# Patient Record
Sex: Female | Born: 1990 | Race: Black or African American | Hispanic: No | Marital: Married
Health system: Southern US, Community
[De-identification: ages and names within clinical notes are randomized; demographics above are authoritative.]

## PROBLEM LIST (undated history)

## (undated) HISTORY — PX: DILATION AND CURETTAGE OF UTERUS: SHX78

---

## 2016-12-19 ENCOUNTER — Emergency Department (HOSPITAL_BASED_OUTPATIENT_CLINIC_OR_DEPARTMENT_OTHER): Payer: Medicaid Other

## 2016-12-19 ENCOUNTER — Encounter (HOSPITAL_BASED_OUTPATIENT_CLINIC_OR_DEPARTMENT_OTHER): Payer: Self-pay | Admitting: *Deleted

## 2016-12-19 ENCOUNTER — Emergency Department (HOSPITAL_BASED_OUTPATIENT_CLINIC_OR_DEPARTMENT_OTHER)
Admission: EM | Admit: 2016-12-19 | Discharge: 2016-12-19 | Disposition: A | Discharge status: Home / Self Care | Payer: Medicaid Other | Attending: Emergency Medicine | Admitting: Emergency Medicine

## 2016-12-19 DIAGNOSIS — W109XXA Fall (on) (from) unspecified stairs and steps, initial encounter: Secondary | ICD-10-CM | POA: Diagnosis not present

## 2016-12-19 DIAGNOSIS — Y999 Unspecified external cause status: Secondary | ICD-10-CM | POA: Insufficient documentation

## 2016-12-19 DIAGNOSIS — Y929 Unspecified place or not applicable: Secondary | ICD-10-CM | POA: Diagnosis not present

## 2016-12-19 DIAGNOSIS — M25572 Pain in left ankle and joints of left foot: Secondary | ICD-10-CM | POA: Diagnosis present

## 2016-12-19 DIAGNOSIS — Y939 Activity, unspecified: Secondary | ICD-10-CM | POA: Insufficient documentation

## 2016-12-19 MED ORDER — ACETAMINOPHEN 325 MG PO TABS
650.0000 mg | ORAL_TABLET | Freq: Once | ORAL | Status: AC
  Administered 2016-12-19: 650 mg via ORAL
  Filled 2016-12-19: qty 2

## 2016-12-19 MED ORDER — IBUPROFEN 400 MG PO TABS
600.0000 mg | ORAL_TABLET | Freq: Once | ORAL | Status: DC
  Filled 2016-12-19: qty 1

## 2016-12-19 MED ORDER — ACETAMINOPHEN 500 MG PO TABS
1000.0000 mg | ORAL_TABLET | Freq: Once | ORAL | Status: DC
  Filled 2016-12-19: qty 2

## 2016-12-19 NOTE — ED Provider Notes (Signed)
[facility] Provider Note   CSN: [id number] Arrival date & time: 12/19/16  1911  By signing my name below, I, Linna Darner, attest that this documentation has been prepared under the direction and in the presence of Sharen Heck, PA-C. Electronically Signed: Linna Darner, Scribe. 12/19/2016. 8:26 PM.  History   Chief Complaint Chief Complaint  Patient presents with  . Ankle Injury   The history is provided by the patient. No language interpreter was used.   HPI Comments: Terri Gilmore is a 26 y.o. female who presents to the Emergency Department for evaluation of a left ankle injury sustained a few hours ago. She states she fell down a couple of steps to the ground and injured her left ankle. No head trauma or LOC. Patient reports pain to her left lateral ankle as well as some swelling. Patient endorses pain exacerbation with dorsiflexion and weight bearing. She is ambulatory with difficulty secondary to pain. No medications or treatments tried PTA. She denies numbness/tingling, bruising, other injuries, or any other associated symptoms.   History reviewed. No pertinent past medical history.  There are no active problems to display for this patient.   Past Surgical History:  Procedure Laterality Date  . DILATION AND CURETTAGE OF UTERUS      OB History    No data available       Home Medications    Prior to Admission medications   Not on File    Family History No family history on file.  Social History Social History  Substance Use Topics  . Smoking status: Not on file  . Smokeless tobacco: Never Used  . Alcohol use No     Allergies   Peanut-containing drug products   Review of Systems Review of Systems  Musculoskeletal: Positive for arthralgias, gait problem and joint swelling.  Skin: Negative for color change.  Neurological: Negative for syncope and numbness.   Physical Exam Updated Vital Signs BP 125/73 (BP Location: Right Arm)   Pulse  76   Temp 98.9 F (37.2 C) (Oral)   Resp 18   Ht 6' (1.829 m)   Wt 99.8 kg (220 lb)   LMP 11/21/2016   SpO2 100%   BMI 29.84 kg/m   Physical Exam  Constitutional: She is oriented to person, place, and time. She appears well-developed and well-nourished. No distress.  NAD.  HENT:  Head: Normocephalic and atraumatic.  Right Ear: External ear normal.  Left Ear: External ear normal.  Nose: Nose normal.  Eyes: Conjunctivae and EOM are normal. No scleral icterus.  Neck: Normal range of motion. Neck supple.  Cardiovascular: Normal rate, regular rhythm and normal heart sounds.   No murmur heard. Pulses:      Dorsalis pedis pulses are 2+ on the right side, and 2+ on the left side.       Posterior tibial pulses are 2+ on the right side, and 2+ on the left side.  Pulmonary/Chest: Effort normal and breath sounds normal. She has no wheezes.  Musculoskeletal: Normal range of motion. She exhibits tenderness. She exhibits no deformity.  Local mild erythema and tenderness to the left lateral mid-foot.  Pain with dorsiflexion and inversion of the ankle.  One small abrasion present. Full passive ROM of bilateral ankles without crepitus.  Patient able to bear weight in ED (4+ steps).   No bony tenderness over posterior aspect of lateral and medial malleoli, navicular bone or 5th metatarsal base.   Achilles tendon is non tender. Negative anterior and  posterior drawer. Negative Talar tilt. Negative syndesmosis squeeze test. Negative Thompson test.   Neurological: She is alert and oriented to person, place, and time.  5/5 strength with ankle dorsiflexion and plantar flexion, bilaterally.  Sensation to light touch intact in the distribution of the obturator nerve and lateral cutaneous nerve  Feet: sensation to light touch intact in the distribution of the saphenous nerve and sural nerve, bilaterally.   Skin: Skin is warm and dry. Capillary refill takes less than 2 seconds.  Psychiatric: She has a  normal mood and affect. Her behavior is normal. Judgment and thought content normal.  Nursing note and vitals reviewed.  ED Treatments / Results  Labs (all labs ordered are listed, but only abnormal results are displayed) Labs Reviewed - No data to display  EKG  EKG Interpretation None       Radiology Dg Ankle Complete Left  Result Date: 12/19/2016 CLINICAL DATA:  26 year old who fell outside of her home earlier today and injured the left ankle. Initial encounter. EXAM: LEFT ANKLE COMPLETE - 3+ VIEW COMPARISON:  None. FINDINGS: Dorsal soft tissue swelling. No evidence of acute fracture. Ankle mortise intact with well-preserved joint space. Well-preserved bone mineral density. No intrinsic osseous abnormalities. No visible joint effusion. IMPRESSION: No osseous abnormality. Electronically Signed   By: Hulan Saashomas  Lawrence M.D.   On: 12/19/2016 20:01    Procedures Procedures (including critical care time)  DIAGNOSTIC STUDIES: Oxygen Saturation is 100% on RA, normal by my interpretation.    COORDINATION OF CARE: 8:26 PM Discussed treatment plan with pt at bedside and pt agreed to plan.  Medications Ordered in ED Medications  ibuprofen (ADVIL,MOTRIN) tablet 600 mg (not administered)  acetaminophen (TYLENOL) tablet 1,000 mg (not administered)  acetaminophen (TYLENOL) tablet 650 mg (650 mg Oral Given 12/19/16 1949)     Initial Impression / Assessment and Plan / ED Course  I have reviewed the triage vital signs and the nursing notes.  Pertinent labs & imaging results that were available during my care of the patient were reviewed by me and considered in my medical decision making (see chart for details).    26 yo female with ankle pain s/p mechanical fall down 2-3 steps PTA. On exam extremity is NVI. Local tenderness, edema to lateral mid foot but ROM preserved but painful.  X-ray negative. Will treat conservatively. Patient given ankle brace, advised Besser dose NSAIDs and f/u with PCP  for re-evaluation for worsening or persistent symptoms. Patient verbalized understanding and agreeable with plan.  Final Clinical Impressions(s) / ED Diagnoses   Final diagnoses:  Acute left ankle pain    New Prescriptions New Prescriptions   No medications on file   I personally performed the services described in this documentation, which was scribed in my presence. The recorded information has been reviewed and is accurate.    Jerrell MylarGibbons, Garnell Begeman J, PA-C 12/19/16 2038    Tilden Fossaees, Elizabeth, MD 12/22/16 [phone]

## 2016-12-19 NOTE — ED Triage Notes (Signed)
Pt c/o left ankle injury x 3 hrs ago  °

## 2016-12-19 NOTE — ED Notes (Signed)
PMS intact before and after. Pt tolerated well. All questions answered. 

## 2016-12-19 NOTE — Discharge Instructions (Signed)
Your ankle x-ray were normal today. I suspect you may have a soft tissue injury involving your ligaments.   We will treat your symptoms conservatively. Use ibuprofen and tylenol every 8 hours for inflammation and pain for the next 48 hours.  Additionally, rest, ice and elevate your foot.  After 48 hours start light range of motion exercises (spell the alphabet with your foot) at least twice daily to prevent joint stiffness. Wear your ankle sleeve for at least 7 days or until pain free.  Follow up with your primary care provider if symptoms persist or worsen.

## 2018-07-09 IMAGING — DX DG ANKLE COMPLETE 3+V*L*
3 series · 3 of 3 positions shown · non-contrast
Comparison: None.

CLINICAL DATA: 25-year-old who fell outside of her home earlier
today and injured the left ankle. Initial encounter.

EXAM:
LEFT ANKLE COMPLETE - 3+ VIEW

[ankle ap]
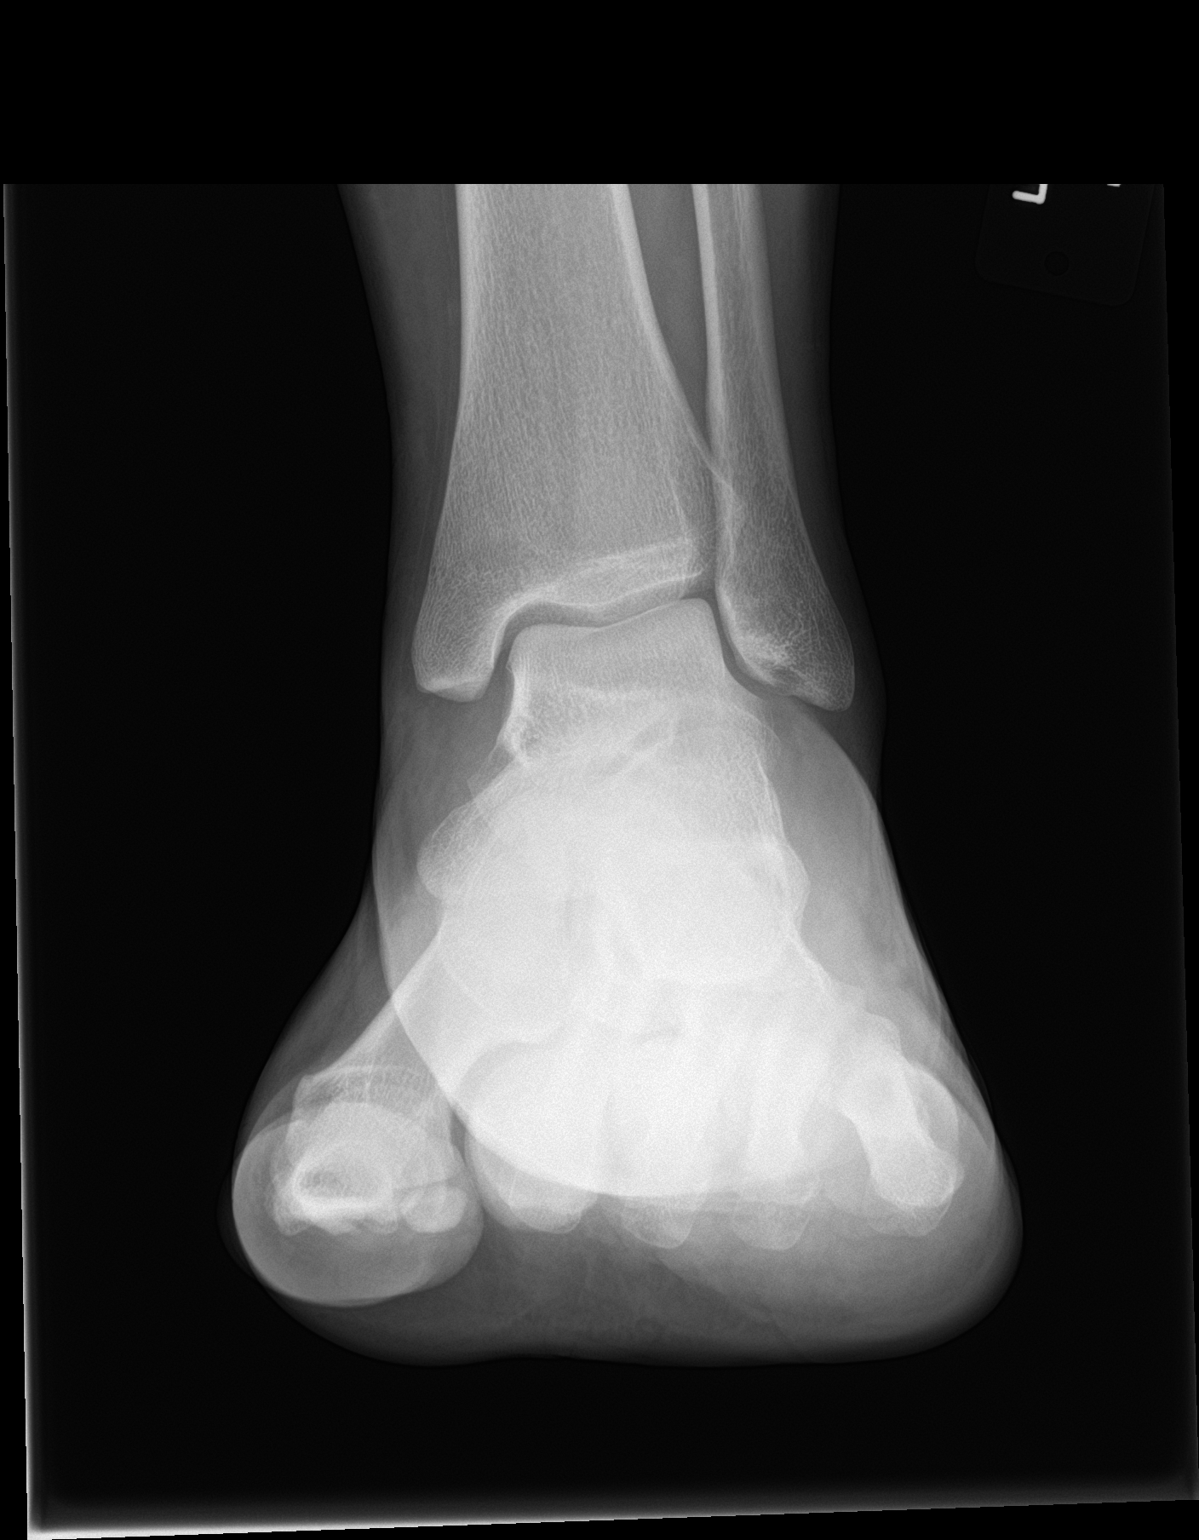

[ankle obl]
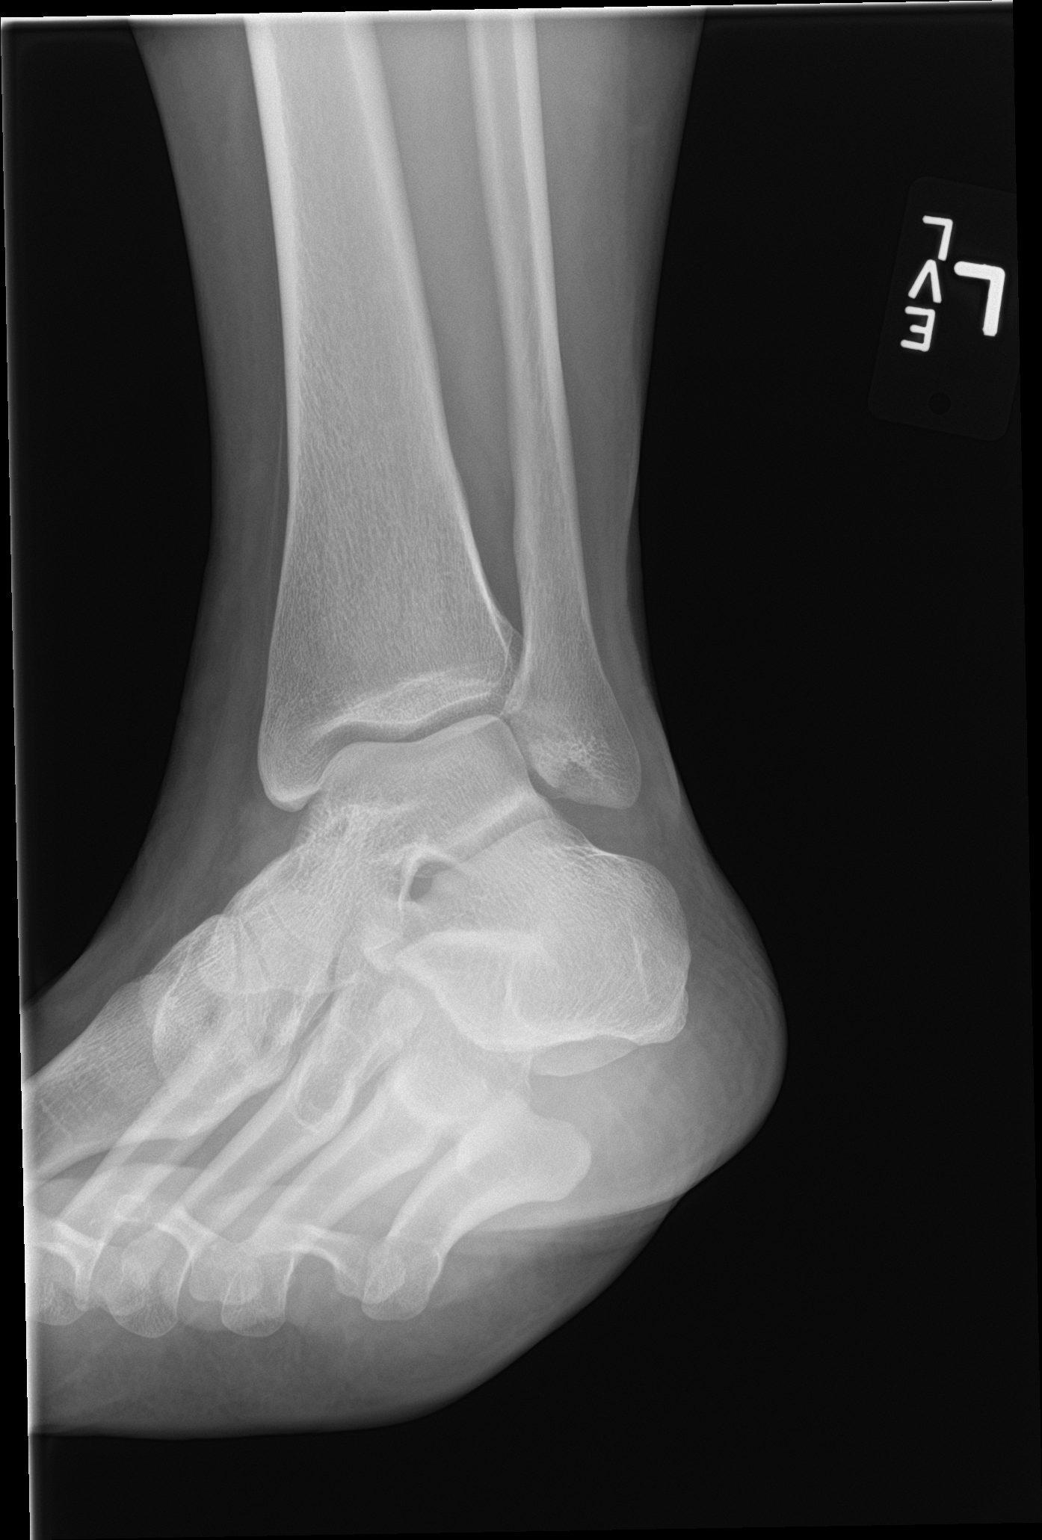

[ankle lat]
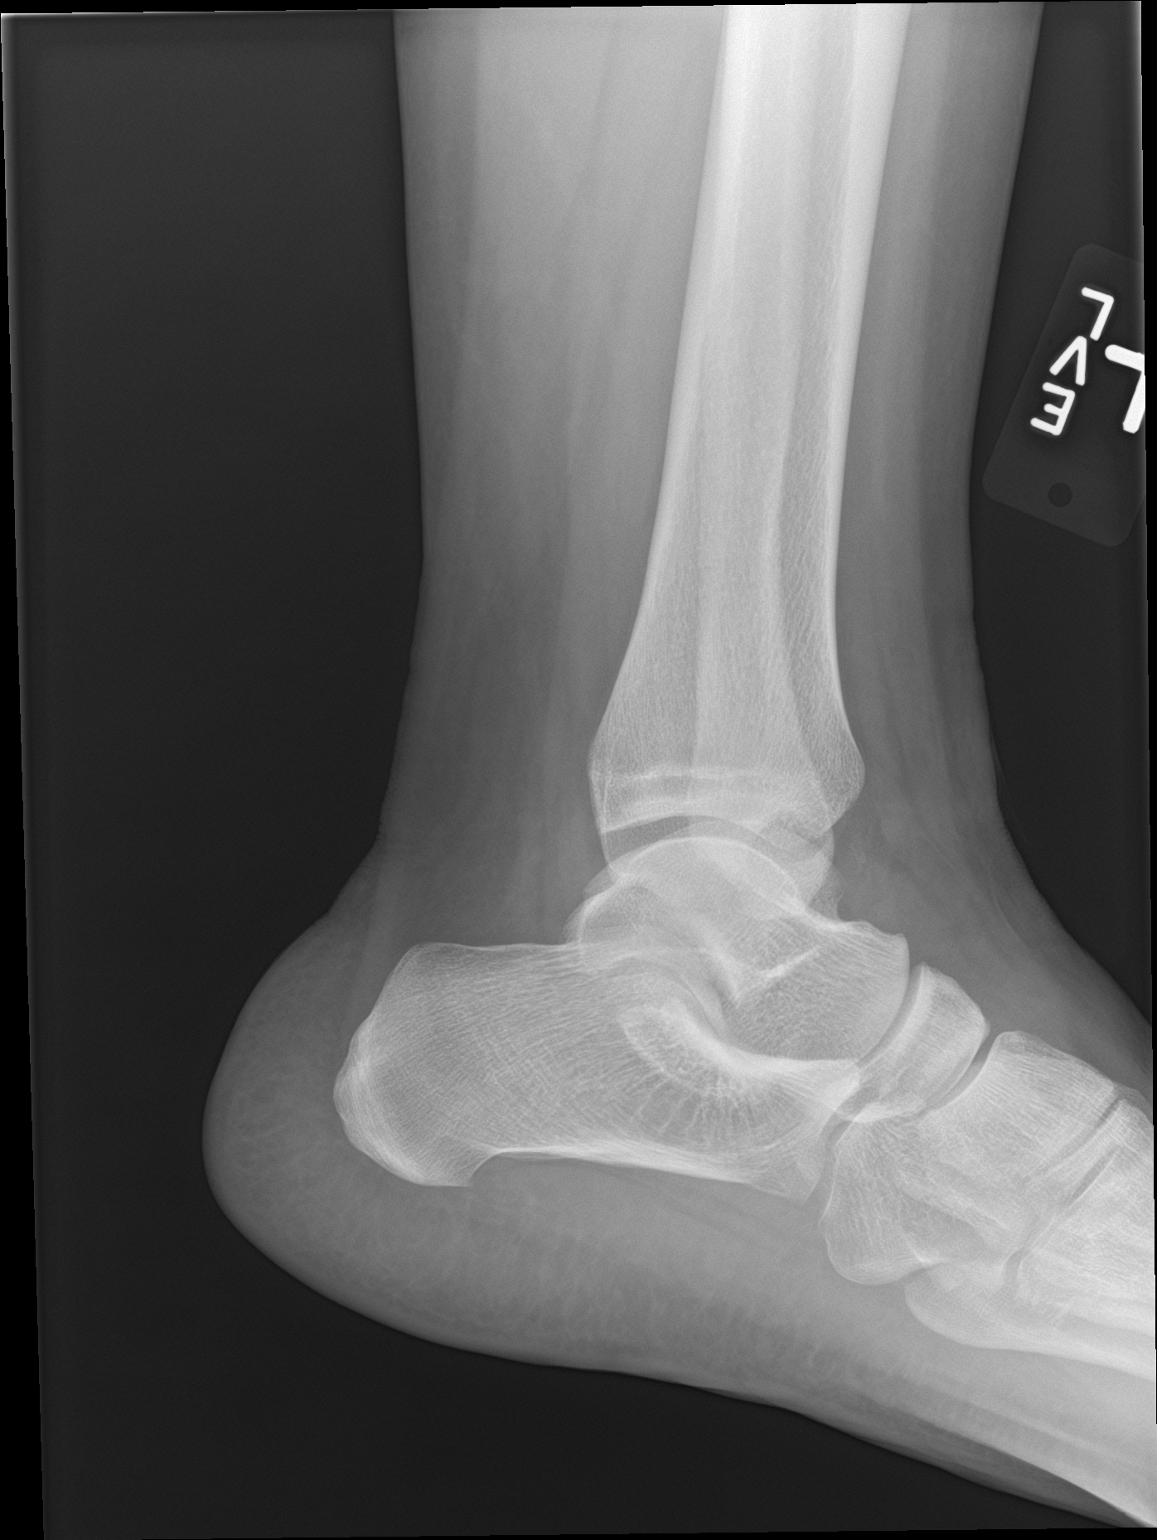

[3 of 3 positions shown; findings below may reference images not displayed]

FINDINGS: Dorsal soft tissue swelling. No evidence of acute fracture. Ankle
mortise intact with well-preserved joint space. Well-preserved bone
mineral density. No intrinsic osseous abnormalities. No visible
joint effusion.
IMPRESSION: No osseous abnormality.
